# Patient Record
Sex: Female | Born: 1955 | Race: White | Hispanic: No | Marital: Married | Smoking: Never smoker
Health system: Southern US, Community
[De-identification: ages and names within clinical notes are randomized; demographics above are authoritative.]

## PROBLEM LIST (undated history)

## (undated) DIAGNOSIS — E079 Disorder of thyroid, unspecified: Secondary | ICD-10-CM

## (undated) DIAGNOSIS — F419 Anxiety disorder, unspecified: Secondary | ICD-10-CM

## (undated) DIAGNOSIS — F329 Major depressive disorder, single episode, unspecified: Secondary | ICD-10-CM

## (undated) DIAGNOSIS — F32A Depression, unspecified: Secondary | ICD-10-CM

## (undated) HISTORY — DX: Disorder of thyroid, unspecified: E07.9

## (undated) HISTORY — DX: Depression, unspecified: F32.A

## (undated) HISTORY — DX: Anxiety disorder, unspecified: F41.9

---
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
## 2019-07-11 ENCOUNTER — Other Ambulatory Visit: Payer: Self-pay

## 2019-07-11 ENCOUNTER — Ambulatory Visit (INDEPENDENT_AMBULATORY_CARE_PROVIDER_SITE_OTHER): Payer: Medicare HMO | Admitting: Psychiatry

## 2019-07-11 ENCOUNTER — Encounter (HOSPITAL_COMMUNITY): Payer: Self-pay | Admitting: Psychiatry

## 2019-07-11 DIAGNOSIS — M797 Fibromyalgia: Secondary | ICD-10-CM | POA: Insufficient documentation

## 2019-07-11 DIAGNOSIS — F3342 Major depressive disorder, recurrent, in full remission: Secondary | ICD-10-CM | POA: Diagnosis not present

## 2019-07-11 DIAGNOSIS — F410 Panic disorder [episodic paroxysmal anxiety] without agoraphobia: Secondary | ICD-10-CM | POA: Diagnosis not present

## 2019-07-11 DIAGNOSIS — F330 Major depressive disorder, recurrent, mild: Secondary | ICD-10-CM | POA: Insufficient documentation

## 2019-07-11 DIAGNOSIS — E039 Hypothyroidism, unspecified: Secondary | ICD-10-CM | POA: Insufficient documentation

## 2019-07-11 DIAGNOSIS — F411 Generalized anxiety disorder: Secondary | ICD-10-CM | POA: Diagnosis not present

## 2019-07-11 DIAGNOSIS — M545 Low back pain, unspecified: Secondary | ICD-10-CM | POA: Insufficient documentation

## 2019-07-11 DIAGNOSIS — K219 Gastro-esophageal reflux disease without esophagitis: Secondary | ICD-10-CM | POA: Insufficient documentation

## 2019-07-11 MED ORDER — CITALOPRAM HYDROBROMIDE 10 MG PO TABS: 10.0000 mg | ORAL_TABLET | Freq: Every day | ORAL | 0 refills | Status: DC

## 2019-07-11 MED ORDER — CLONAZEPAM 1 MG PO TABS: 1.0000 mg | ORAL_TABLET | Freq: Every day | ORAL | 0 refills | Status: DC

## 2019-07-11 MED ORDER — TIZANIDINE HCL 2 MG PO TABS: 2.0000 mg | ORAL_TABLET | Freq: Every day | ORAL | 0 refills | Status: AC

## 2019-07-11 MED ORDER — TRAZODONE HCL 50 MG PO TABS: 50.0000 mg | ORAL_TABLET | Freq: Every day | ORAL | 0 refills | Status: DC
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
## 2019-08-08 ENCOUNTER — Other Ambulatory Visit: Payer: Self-pay | Admitting: Medical Oncology

## 2019-08-08 DIAGNOSIS — E039 Hypothyroidism, unspecified: Secondary | ICD-10-CM

## 2019-08-08 DIAGNOSIS — Z780 Asymptomatic menopausal state: Secondary | ICD-10-CM

## 2019-08-08 DIAGNOSIS — Z1231 Encounter for screening mammogram for malignant neoplasm of breast: Secondary | ICD-10-CM

## 2019-08-08 DIAGNOSIS — Z13820 Encounter for screening for osteoporosis: Secondary | ICD-10-CM

## 2019-08-29 ENCOUNTER — Encounter (INDEPENDENT_AMBULATORY_CARE_PROVIDER_SITE_OTHER): Payer: Self-pay

## 2019-08-29 ENCOUNTER — Other Ambulatory Visit: Payer: Self-pay

## 2019-08-29 ENCOUNTER — Ambulatory Visit
Admission: RE | Admit: 2019-08-29 | Discharge: 2019-08-29 | Disposition: A | Discharge status: Home / Self Care | Payer: Medicare HMO | Source: Ambulatory Visit | Attending: Medical Oncology | Admitting: Medical Oncology

## 2019-08-29 DIAGNOSIS — Z1231 Encounter for screening mammogram for malignant neoplasm of breast: Secondary | ICD-10-CM | POA: Insufficient documentation

## 2019-08-29 DIAGNOSIS — E039 Hypothyroidism, unspecified: Secondary | ICD-10-CM

## 2019-08-29 DIAGNOSIS — Z780 Asymptomatic menopausal state: Secondary | ICD-10-CM | POA: Insufficient documentation

## 2019-08-29 DIAGNOSIS — Z13820 Encounter for screening for osteoporosis: Secondary | ICD-10-CM | POA: Insufficient documentation

## 2019-10-02 ENCOUNTER — Other Ambulatory Visit: Payer: Self-pay

## 2019-10-02 ENCOUNTER — Ambulatory Visit (HOSPITAL_COMMUNITY): Payer: Medicare HMO | Admitting: Psychiatry

## 2019-10-07 ENCOUNTER — Other Ambulatory Visit (HOSPITAL_COMMUNITY): Payer: Self-pay | Admitting: Psychiatry

## 2019-10-10 ENCOUNTER — Other Ambulatory Visit: Payer: Self-pay

## 2019-10-10 ENCOUNTER — Ambulatory Visit (INDEPENDENT_AMBULATORY_CARE_PROVIDER_SITE_OTHER): Payer: Medicare HMO | Admitting: Psychiatry

## 2019-10-10 DIAGNOSIS — F411 Generalized anxiety disorder: Secondary | ICD-10-CM

## 2019-10-10 DIAGNOSIS — F410 Panic disorder [episodic paroxysmal anxiety] without agoraphobia: Secondary | ICD-10-CM | POA: Diagnosis not present

## 2019-10-10 DIAGNOSIS — F3342 Major depressive disorder, recurrent, in full remission: Secondary | ICD-10-CM | POA: Diagnosis not present

## 2019-10-10 MED ORDER — CLONAZEPAM 1 MG PO TABS: 1.0000 mg | ORAL_TABLET | Freq: Every day | ORAL | 0 refills | Status: DC
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
## 2020-01-08 ENCOUNTER — Ambulatory Visit (INDEPENDENT_AMBULATORY_CARE_PROVIDER_SITE_OTHER): Payer: Medicare HMO | Admitting: Psychiatry

## 2020-01-08 ENCOUNTER — Other Ambulatory Visit: Payer: Self-pay

## 2020-01-08 DIAGNOSIS — F410 Panic disorder [episodic paroxysmal anxiety] without agoraphobia: Secondary | ICD-10-CM

## 2020-01-08 DIAGNOSIS — F3342 Major depressive disorder, recurrent, in full remission: Secondary | ICD-10-CM

## 2020-01-08 DIAGNOSIS — F411 Generalized anxiety disorder: Secondary | ICD-10-CM

## 2020-01-08 MED ORDER — CLONAZEPAM 1 MG PO TABS: 1.0000 mg | ORAL_TABLET | Freq: Every day | ORAL | 0 refills | Status: DC

## 2020-01-08 MED ORDER — CITALOPRAM HYDROBROMIDE 20 MG PO TABS: 20.0000 mg | ORAL_TABLET | Freq: Every evening | ORAL | 1 refills | Status: DC

## 2020-01-08 MED ORDER — TRAZODONE HCL 50 MG PO TABS: ORAL_TABLET | ORAL | 1 refills | Status: DC
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
## 2020-04-05 ENCOUNTER — Other Ambulatory Visit: Payer: Self-pay

## 2020-04-05 ENCOUNTER — Telehealth (INDEPENDENT_AMBULATORY_CARE_PROVIDER_SITE_OTHER): Payer: Medicare HMO | Admitting: Psychiatry

## 2020-04-05 DIAGNOSIS — F411 Generalized anxiety disorder: Secondary | ICD-10-CM

## 2020-04-05 DIAGNOSIS — F410 Panic disorder [episodic paroxysmal anxiety] without agoraphobia: Secondary | ICD-10-CM | POA: Diagnosis not present

## 2020-04-05 DIAGNOSIS — F3342 Major depressive disorder, recurrent, in full remission: Secondary | ICD-10-CM

## 2020-04-05 MED ORDER — CLONAZEPAM 1 MG PO TABS: 1.0000 mg | ORAL_TABLET | Freq: Every day | ORAL | 0 refills | Status: DC
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
## 2020-05-21 ENCOUNTER — Telehealth (HOSPITAL_COMMUNITY): Payer: Self-pay | Admitting: *Deleted

## 2020-05-21 ENCOUNTER — Other Ambulatory Visit (HOSPITAL_COMMUNITY): Payer: Self-pay | Admitting: Psychiatry

## 2020-05-21 MED ORDER — CLONAZEPAM 0.5 MG PO TABS: 0.5000 mg | ORAL_TABLET | Freq: Three times a day (TID) | ORAL | 2 refills | Status: DC | PRN

## 2020-05-21 NOTE — Telephone Encounter (Signed)
I renewed Rx as 0.5 mg tid as needed so she can still take 1 mg at  bedtime but will be able to take a lower dose (so she is not excessively sedated) as needed during the day

## 2020-05-21 NOTE — Telephone Encounter (Signed)
Pt called c/o increased anxiety r/t finding out that husband has two different types of cancer she says. Pt takes Klonopin 1 mg qhs but is asking for "something" she can take during the day for anxiety. Pt has an upcoming appointment on 07/05/20. Please review and advise.

## 2020-06-25 ENCOUNTER — Other Ambulatory Visit (HOSPITAL_COMMUNITY): Payer: Self-pay | Admitting: *Deleted

## 2020-06-25 ENCOUNTER — Other Ambulatory Visit (HOSPITAL_COMMUNITY): Payer: Self-pay | Admitting: Psychiatry

## 2020-06-25 MED ORDER — CITALOPRAM HYDROBROMIDE 20 MG PO TABS: 20.0000 mg | ORAL_TABLET | Freq: Every evening | ORAL | 0 refills | Status: DC

## 2020-06-28 ENCOUNTER — Other Ambulatory Visit (HOSPITAL_COMMUNITY): Payer: Self-pay | Admitting: Psychiatry

## 2020-07-05 ENCOUNTER — Telehealth (INDEPENDENT_AMBULATORY_CARE_PROVIDER_SITE_OTHER): Payer: Medicare HMO | Admitting: Psychiatry

## 2020-07-05 ENCOUNTER — Other Ambulatory Visit: Payer: Self-pay

## 2020-07-05 DIAGNOSIS — F410 Panic disorder [episodic paroxysmal anxiety] without agoraphobia: Secondary | ICD-10-CM

## 2020-07-05 DIAGNOSIS — F411 Generalized anxiety disorder: Secondary | ICD-10-CM

## 2020-07-05 DIAGNOSIS — F3342 Major depressive disorder, recurrent, in full remission: Secondary | ICD-10-CM

## 2020-07-05 MED ORDER — TIZANIDINE HCL 2 MG PO TABS: 2.0000 mg | ORAL_TABLET | Freq: Every day | ORAL | 0 refills | Status: DC

## 2020-07-05 MED ORDER — CLONAZEPAM 0.5 MG PO TABS: 0.5000 mg | ORAL_TABLET | Freq: Three times a day (TID) | ORAL | 2 refills | Status: DC | PRN
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
## 2020-09-25 ENCOUNTER — Other Ambulatory Visit (HOSPITAL_COMMUNITY): Payer: Self-pay | Admitting: Psychiatry

## 2020-10-04 ENCOUNTER — Telehealth (INDEPENDENT_AMBULATORY_CARE_PROVIDER_SITE_OTHER): Payer: Medicare HMO | Admitting: Psychiatry

## 2020-10-04 ENCOUNTER — Other Ambulatory Visit: Payer: Self-pay

## 2020-10-04 ENCOUNTER — Other Ambulatory Visit (HOSPITAL_COMMUNITY): Payer: Self-pay | Admitting: Psychiatry

## 2020-10-04 DIAGNOSIS — F330 Major depressive disorder, recurrent, mild: Secondary | ICD-10-CM | POA: Diagnosis not present

## 2020-10-04 DIAGNOSIS — F411 Generalized anxiety disorder: Secondary | ICD-10-CM

## 2020-10-04 DIAGNOSIS — F410 Panic disorder [episodic paroxysmal anxiety] without agoraphobia: Secondary | ICD-10-CM | POA: Diagnosis not present

## 2020-10-04 MED ORDER — TRAZODONE HCL 100 MG PO TABS: 100.0000 mg | ORAL_TABLET | Freq: Every evening | ORAL | 1 refills | Status: DC | PRN

## 2020-10-04 MED ORDER — TIZANIDINE HCL 2 MG PO TABS: 2.0000 mg | ORAL_TABLET | Freq: Every day | ORAL | 0 refills | Status: DC

## 2020-10-04 MED ORDER — CLONAZEPAM 0.5 MG PO TABS: 0.5000 mg | ORAL_TABLET | Freq: Three times a day (TID) | ORAL | 2 refills | Status: DC | PRN
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
## 2020-12-09 ENCOUNTER — Other Ambulatory Visit (HOSPITAL_COMMUNITY): Payer: Self-pay | Admitting: Psychiatry

## 2020-12-27 ENCOUNTER — Other Ambulatory Visit: Payer: Self-pay | Admitting: Physician Assistant

## 2020-12-27 DIAGNOSIS — Z1231 Encounter for screening mammogram for malignant neoplasm of breast: Secondary | ICD-10-CM

## 2021-01-03 ENCOUNTER — Telehealth (INDEPENDENT_AMBULATORY_CARE_PROVIDER_SITE_OTHER): Payer: Medicare HMO | Admitting: Psychiatry

## 2021-01-03 ENCOUNTER — Other Ambulatory Visit (HOSPITAL_COMMUNITY): Payer: Self-pay | Admitting: Psychiatry

## 2021-01-03 ENCOUNTER — Other Ambulatory Visit: Payer: Self-pay

## 2021-01-03 DIAGNOSIS — F410 Panic disorder [episodic paroxysmal anxiety] without agoraphobia: Secondary | ICD-10-CM | POA: Diagnosis not present

## 2021-01-03 DIAGNOSIS — F411 Generalized anxiety disorder: Secondary | ICD-10-CM | POA: Diagnosis not present

## 2021-01-03 DIAGNOSIS — F330 Major depressive disorder, recurrent, mild: Secondary | ICD-10-CM | POA: Diagnosis not present

## 2021-01-03 MED ORDER — DOXEPIN HCL 10 MG PO CAPS: 10.0000 mg | ORAL_CAPSULE | Freq: Every day | ORAL | 2 refills | Status: DC

## 2021-01-03 MED ORDER — CLONAZEPAM 0.5 MG PO TABS: 0.5000 mg | ORAL_TABLET | Freq: Two times a day (BID) | ORAL | 2 refills | Status: DC | PRN
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
## 2021-01-08 ENCOUNTER — Telehealth (HOSPITAL_COMMUNITY): Payer: Self-pay | Admitting: *Deleted

## 2021-01-08 NOTE — Telephone Encounter (Signed)
She should try doubling the dose - I have no further sleep aids that she did not try (and failed) except for Rozerem which is a "super-melatonin" and I am not sue if it is covered by her insurance (I don't find it to be very effective).

## 2021-01-08 NOTE — Telephone Encounter (Signed)
Pt called stating that the Doxepin 10 mg prescribed on 01/03/21, is not working for her. She wants to know if she can increase dose or try something else. Please review and advise. Thanks.

## 2021-01-08 NOTE — Telephone Encounter (Signed)
Ok. I will advise pt.

## 2021-01-09 ENCOUNTER — Other Ambulatory Visit: Payer: Self-pay

## 2021-01-09 ENCOUNTER — Telehealth (HOSPITAL_COMMUNITY): Payer: Self-pay | Admitting: *Deleted

## 2021-01-09 ENCOUNTER — Ambulatory Visit
Admission: RE | Admit: 2021-01-09 | Discharge: 2021-01-09 | Disposition: A | Discharge status: Home / Self Care | Payer: Medicare HMO | Source: Ambulatory Visit | Attending: Physician Assistant | Admitting: Physician Assistant

## 2021-01-09 DIAGNOSIS — Z1231 Encounter for screening mammogram for malignant neoplasm of breast: Secondary | ICD-10-CM | POA: Diagnosis present

## 2021-01-09 NOTE — Telephone Encounter (Signed)
Writer left VM for pt advising that she may take 20 mg of the Doxepin and if that helps to call back and let us know so that new Rx for #60 can be sent in. Pt encouraged to call nurse line with any questions or concerns.

## 2021-01-10 ENCOUNTER — Telehealth (HOSPITAL_COMMUNITY): Payer: Self-pay

## 2021-01-10 NOTE — Telephone Encounter (Signed)
Medicaiton management - Telephone message left for patient that Dr. Hinton Dyer approved her to go up on Doxepin to 30 mg at bedtime for sleep and to let us know how that works in the coming week. Requested patient call back if any questions and if works would then see about getting a new order sent in for patient.  Patient to call back in the coming week with update.

## 2021-01-10 NOTE — Telephone Encounter (Signed)
Agree for her to go up although I have doubts it will prove more effective at 30 mg dose.

## 2021-01-10 NOTE — Telephone Encounter (Signed)
Medication management - Telephone message left for pt after she left one questioning if she could now go up to 30 mg a day of Doxepin.  Agreed to send request to Dr. Hinton Dyer but also reiterated pt. just went up to 20 mg on yesterday on message and to stay at that new dose until Dr. Hinton Dyer approves any further increases.  Patient stated on message she had tried the 20 mg at bedtime but that it still took her about an hour to go to sleep and that she was still waking up intermittently.  Requested to go up to 30 mg at bedtime if Dr. Hinton Dyer agreed.

## 2021-01-13 ENCOUNTER — Other Ambulatory Visit (HOSPITAL_COMMUNITY): Payer: Self-pay | Admitting: *Deleted

## 2021-01-13 MED ORDER — DOXEPIN HCL 10 MG PO CAPS: 30.0000 mg | ORAL_CAPSULE | Freq: Every day | ORAL | 2 refills | Status: DC

## 2021-02-06 ENCOUNTER — Other Ambulatory Visit (HOSPITAL_COMMUNITY): Payer: Self-pay | Admitting: Psychiatry

## 2021-02-06 ENCOUNTER — Telehealth (HOSPITAL_COMMUNITY): Payer: Self-pay | Admitting: *Deleted

## 2021-02-06 MED ORDER — CLONAZEPAM 0.5 MG PO TABS: 0.5000 mg | ORAL_TABLET | Freq: Two times a day (BID) | ORAL | 2 refills | Status: AC | PRN

## 2021-02-06 NOTE — Telephone Encounter (Signed)
Thank you :)

## 2021-02-06 NOTE — Telephone Encounter (Signed)
She now has 4 refills!
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
## 2021-04-24 ENCOUNTER — Telehealth (HOSPITAL_COMMUNITY): Payer: Self-pay | Admitting: *Deleted

## 2021-04-24 NOTE — Telephone Encounter (Signed)
Patient is a former Dr. Hinton Dyer patient who needs a refill of Doxepin.

## 2021-04-25 MED ORDER — DOXEPIN HCL 10 MG PO CAPS: 30.0000 mg | ORAL_CAPSULE | Freq: Every day | ORAL | 0 refills | Status: AC

## 2021-04-25 NOTE — Telephone Encounter (Signed)
A thirty days bridge supply of Doxepin 30 mg is given by Covering MD. Patient must established care with new provider.

## 2022-09-22 IMAGING — MG MM DIGITAL SCREENING BILAT W/ TOMO AND CAD
8 series · 8 of 24 positions shown · non-contrast
Comparison: Previous exam(s).

CLINICAL DATA: Screening.

EXAM:
DIGITAL SCREENING BILATERAL MAMMOGRAM WITH TOMOSYNTHESIS AND CAD
TECHNIQUE: Bilateral screening digital craniocaudal and mediolateral oblique
mammograms were obtained. Bilateral screening digital breast
tomosynthesis was performed. The images were evaluated with
computer-aided detection.

[L CC synth-2D]
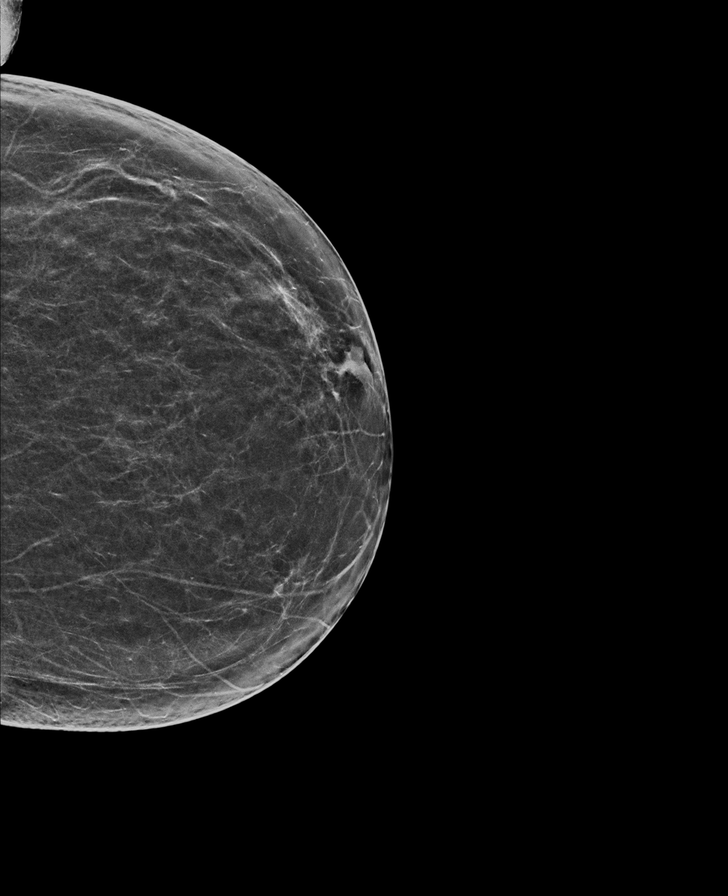

[L MLO synth-2D]
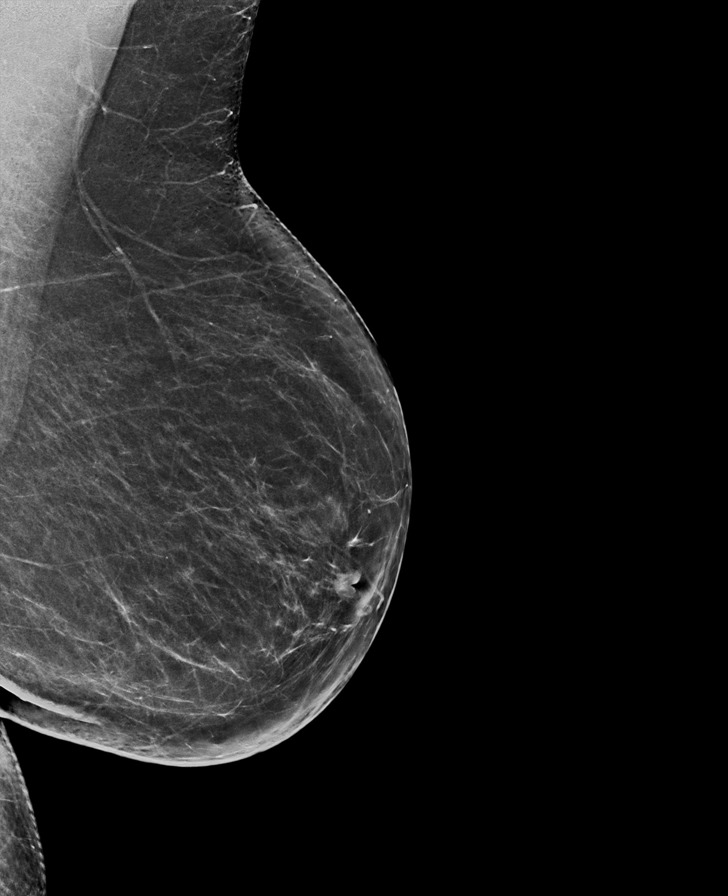

[R CC synth-2D]
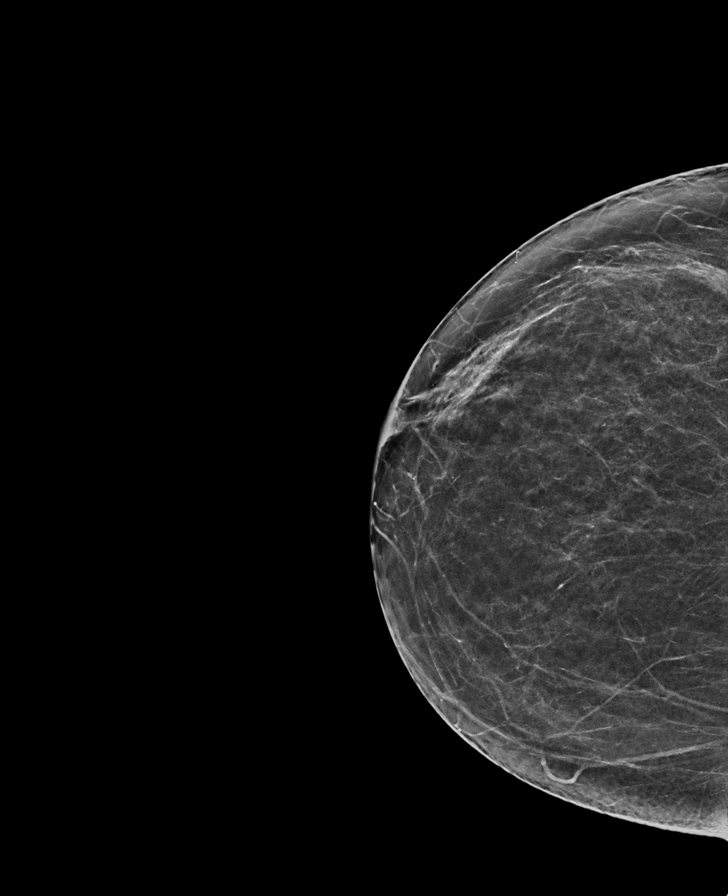

[R MLO synth-2D]
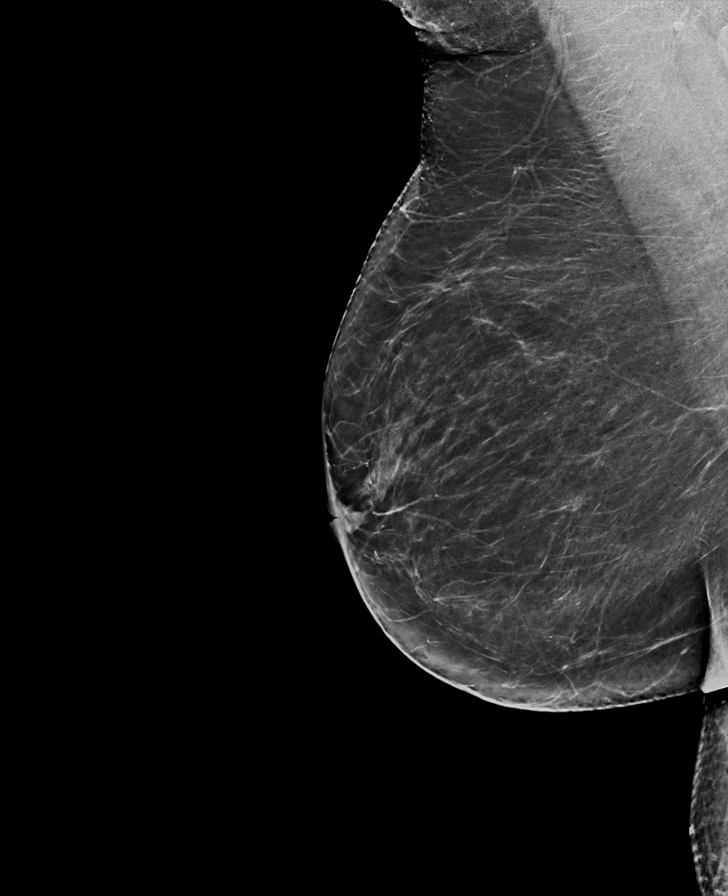

[R MLO tomo · tomo slice 38/75.0]
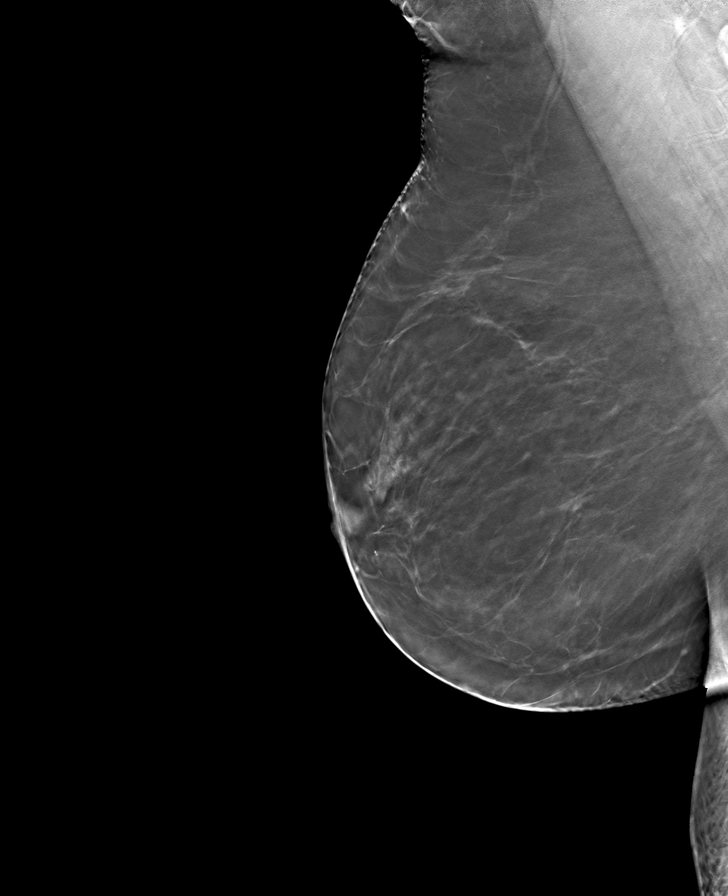

[L MLO tomo · tomo slice 37/74.0]
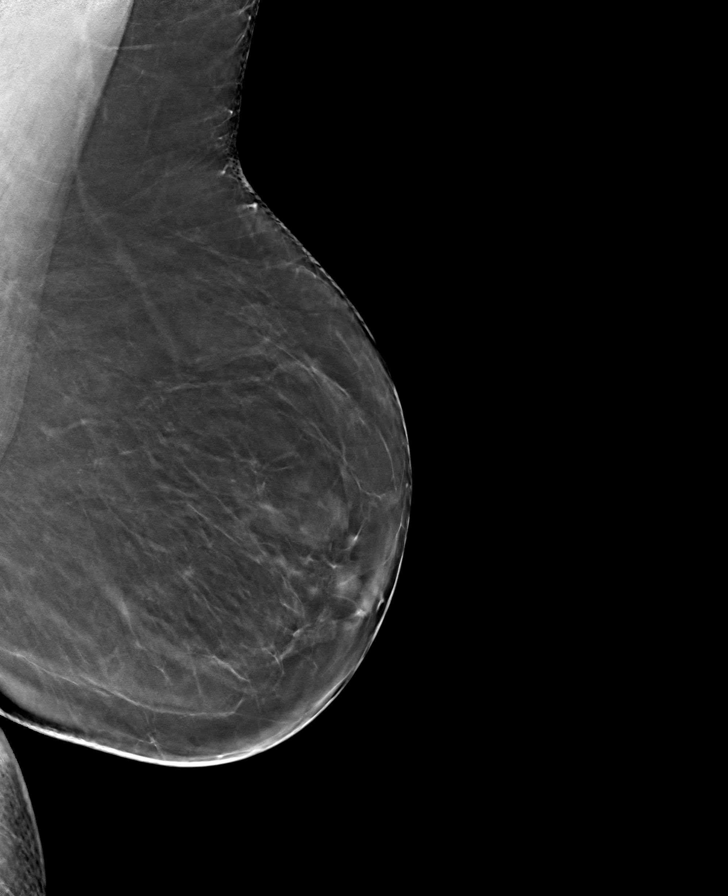

[R CC tomo · tomo slice 32/63.0]
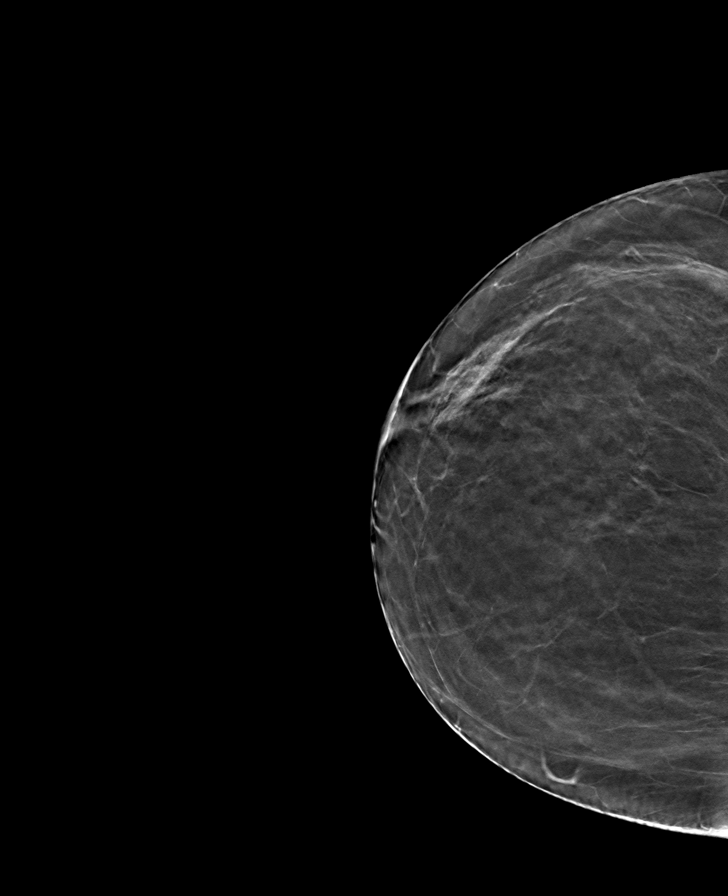

[L CC tomo · tomo slice 32/63.0]
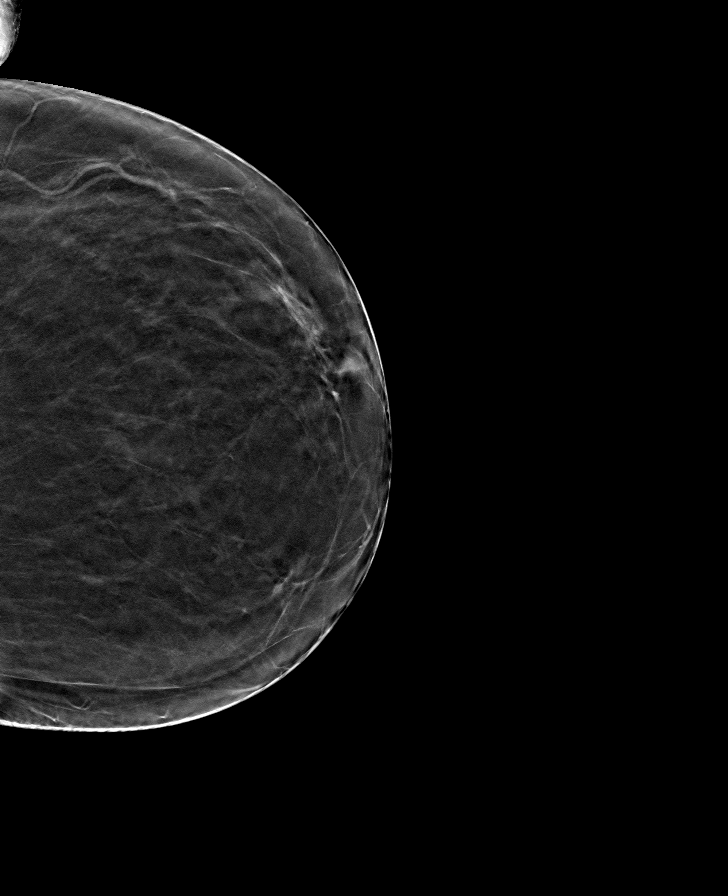

[8 of 24 positions shown; findings below may reference images not displayed]

ACR Breast Density Category b: There are scattered areas of
fibroglandular density.
FINDINGS: There are no findings suspicious for malignancy.
IMPRESSION: No mammographic evidence of malignancy. A result letter of this
screening mammogram will be mailed directly to the patient.

RECOMMENDATION:
Screening mammogram in one year. (Code:51-O-LD2)

BI-RADS CATEGORY  1: Negative.
